# Patient Record
Sex: Male | Born: 2000
Health system: Southern US, Community
[De-identification: ages and names within clinical notes are randomized; demographics above are authoritative.]

---
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
## 2019-01-31 DIAGNOSIS — J3081 Allergic rhinitis due to animal (cat) (dog) hair and dander: Secondary | ICD-10-CM | POA: Diagnosis not present

## 2019-01-31 DIAGNOSIS — J3089 Other allergic rhinitis: Secondary | ICD-10-CM | POA: Diagnosis not present

## 2019-01-31 DIAGNOSIS — J301 Allergic rhinitis due to pollen: Secondary | ICD-10-CM | POA: Diagnosis not present

## 2019-01-31 DIAGNOSIS — H1045 Other chronic allergic conjunctivitis: Secondary | ICD-10-CM | POA: Diagnosis not present

## 2019-04-09 ENCOUNTER — Other Ambulatory Visit: Payer: Self-pay | Admitting: Psychiatry

## 2019-04-11 NOTE — Telephone Encounter (Signed)
Has appt 79/03  Sent duplicate requests

## 2019-04-11 NOTE — Telephone Encounter (Signed)
Patient is preparing for college and mother requests that his 2 medications from 10/18/2018 appointment be filled at her [facility] mail service sending duplicate escriptions apparently relative to upcoming college as 290-day E scription's each for sertraline 25 mg every morning and guanfacine 1 mg ER every bedtime.

## 2019-04-18 ENCOUNTER — Encounter: Payer: Self-pay | Admitting: Psychiatry

## 2019-04-18 ENCOUNTER — Ambulatory Visit: Payer: BC Managed Care – PPO | Admitting: Psychiatry

## 2019-04-18 ENCOUNTER — Other Ambulatory Visit: Payer: Self-pay

## 2019-04-18 VITALS — Ht 65.0 in | Wt 120.0 lb

## 2019-04-18 DIAGNOSIS — F411 Generalized anxiety disorder: Secondary | ICD-10-CM | POA: Diagnosis not present

## 2019-04-18 DIAGNOSIS — F902 Attention-deficit hyperactivity disorder, combined type: Secondary | ICD-10-CM

## 2019-04-18 DIAGNOSIS — F819 Developmental disorder of scholastic skills, unspecified: Secondary | ICD-10-CM | POA: Diagnosis not present

## 2019-04-18 MED ORDER — GUANFACINE HCL ER 1 MG PO TB24: 1.0000 mg | ORAL_TABLET | Freq: Every day | ORAL | 0 refills | Status: DC

## 2019-04-18 MED ORDER — SERTRALINE HCL 25 MG PO TABS: 25.0000 mg | ORAL_TABLET | Freq: Every morning | ORAL | 0 refills | Status: DC

## 2019-04-18 NOTE — Progress Notes (Signed)
[facility]  Patient ID: Jason GandyJohn Thomas Snow,  MRN: [id number]  PCP: Berline Lopes'Kelley, Brian, MD  Date of Evaluation: 04/18/2019 Time spent:15 minutes from 1415 to 1430  Chief Complaint:  Chief Complaint    Anxiety; ADHD; Stress      HISTORY/CURRENT STATUS: Jason Snow is seen onsite in office face-to-face conjointly with adoptive brother and mother with consent with epic collateral for adolescent psychiatric interview and exam and 3376-month evaluation and management of ADHD, generalized anxiety, and developmental scholastic skill disorder all of which may present some vulnerability and obstacle to freshman academic and social skills when at [employer] this fall.  As of last appointment, his acceptance and choice for college were still broad-based among 5 schools, but he and family are pleased with his choice and acceptance to [employer].  Jason Snow shows little or no emotion today in this regard at a time when he would be expected to be excited and emotionally mobilized.  Diagnostically this may question whether social communication disorder is present along with his scholastic skill disorder, though testing by Dr. Pascal LuxKane has been very thorough in past testing but patient's participation may have been suboptimal.  Similarly, he is untested in the college academic and social environment but highly intelligent.  Whether modifications may become necessary is not addressed with patient today as he finds such preparations culturally alienating and unnecessary rather than helpful and adaptive.  He will say that he did not get to go to [place] this summer but mother adds that he is still eligible for the [employer] adult trip to [place] for next summer.  He had contact with his roommate to be in the high-rise dorm in [place] so that they have been briefly introduced, though mother finds it remarkable that he still does not know where the roommate is from or what they share in common, particularly as they must share a living  space.  Mother is apprehensive about social distancing and mixed exposures in that setting in [place], but she is happy for the patient to have the educational opportunity.  Patient seems to have limited interaction with the adoptive father while his adoptive brother seems to have more of such interaction recently.  Patient has maintained medications and is compliant when younger brother is not always compliant.  The patient is unable to work out at the gym in this stay at home time of closure.  No adjustment in medications appears necessary today though we prepare for such in case the patient has increased anxiety or focus and consistency problems at college.  Session was brief last appointment as mother was in recovery and brother was overwhelmed, needing to be expeditious in history taking today for coronavirus precautions but more detailed in historical updating in preparation for his start of college.  He has no delirium, mania, psychosis, or dissociation.  He has no substance use or self-harm.  Anxiety        Symptoms include excessive worry and nervous/anxious behavior. Patient reports no decreased concentration,  panic or phobia, habit or cognitive fixation, depressed mood, malaise, nausea, obsessions, palpitations or suicidal ideas. Symptoms occur occasionally. The severity of symptoms is moderate. The quality of sleep is fair. Nighttime awakenings: occasional. Compliance with medications is 76-100%. Side effects of treatment include auditory problems.   Individual Medical History/ Review of Systems: Changes? :No There is no change in body habitus, metabolism, or hormonal developmental adaptation.  Allergies: Amantadines, Cephalosporins, and Minocycline  Current Medications:  Current Outpatient Medications:  .  guanFACINE (INTUNIV) 1 MG TB24 ER tablet, TAKE 1 TABLET BY MOUTH AT BEDTIME, Disp: 90 tablet, Rfl: 0 .  [START ON 07/10/2019] guanFACINE (INTUNIV) 1 MG TB24 ER tablet, Take 1 tablet (1 mg  total) by mouth at bedtime., Disp: 90 tablet, Rfl: 0 .  sertraline (ZOLOFT) 25 MG tablet, TAKE 1 TABLET BY MOUTH EVERY MORNING, Disp: 90 tablet, Rfl: 0 .  [START ON 07/10/2019] sertraline (ZOLOFT) 25 MG tablet, Take 1 tablet (25 mg total) by mouth every morning., Disp: 90 tablet, Rfl: 0   Medication Side Effects: none  Family Medical/ Social History: Changes? No ras elative closure of childhood for patient in the adoptive family is admitted emotionally by the patient's cultural expectations.  MENTAL HEALTH EXAM:  Height 5\' 5"  (1.651 m), weight 120 lb (54.4 kg).Body mass index is 19.97 kg/m.  Others deferred as nonessential in coronavirus pandemic  General Appearance: Casual, Guarded, Meticulous and Well Groomed  Eye Contact:  Good  Speech:  Clear and Coherent and Normal Rate  Volume:  Increased to normal  Mood:  Anxious and Euthymic  Affect:  Flat, Inappropriate, Restricted and Anxious  Thought Process:  Goal Directed, Irrelevant and Linear  Orientation:  Full (Time, Place, and Person)  Thought Content: Obsessions and Rumination   Suicidal Thoughts:  No  Homicidal Thoughts:  No  Memory:  Immediate;   Good Remote;   Fair  Judgement:  Good  Insight:  Fair  Psychomotor Activity:  Normal, Decreased and Mannerisms  Concentration:  Concentration: Fair and Attention Span: Fair  Recall:  Good  Fund of Knowledge: Good  Language: Good  Assets:  Desire for Improvement Leisure Time Talents/Skills Vocational/Educational  ADL's:  Intact  Cognition: WNL  Prognosis:  Good    DIAGNOSES:    ICD-10-CM   1. GAD (generalized anxiety disorder)  F41.1 sertraline (ZOLOFT) 25 MG tablet  2. Attention deficit hyperactivity disorder, combined type  F90.2 guanFACINE (INTUNIV) 1 MG TB24 ER tablet  3. Developmental disorder of scholastic skill  F81.9     Receiving Psychotherapy: No    RECOMMENDATIONS: Diagnosis such as social communication disorder is challenging to interpret being adopted from  [place] in family blending that alienates opportunity for patient's acquisition of more intimacy and emotional expression.  Still he exhibits and expresses that he is comfortable and capable without acute need for change.  The long-term outcome for patient appears good.  Accommodations can be updated at [facility] if needed though patient may not be interested in such similar to his posture today.  [facility] required duplicate E scription's for his sertraline 25 mg every morning for generalized anxiety and Intuniv 1 mg every bedtime for ADHD sent as duplicated 56-EPP supply of each on 04/11/2019 such that for his likelihood to still be at [facility] when refill might be necessary, an additional fill is sent for both as of 07/09/2021 to [facility].  He will be seen for follow-up in 5 months after first semester in college.   Delight Hoh, MD

## 2019-05-28 ENCOUNTER — Other Ambulatory Visit: Payer: Self-pay | Admitting: Psychiatry

## 2019-05-29 DIAGNOSIS — Z20828 Contact with and (suspected) exposure to other viral communicable diseases: Secondary | ICD-10-CM | POA: Diagnosis not present

## 2019-06-04 ENCOUNTER — Other Ambulatory Visit: Payer: Self-pay | Admitting: Emergency Medicine

## 2019-06-04 DIAGNOSIS — R6889 Other general symptoms and signs: Secondary | ICD-10-CM | POA: Diagnosis not present

## 2019-06-04 DIAGNOSIS — Z20822 Contact with and (suspected) exposure to covid-19: Secondary | ICD-10-CM

## 2019-06-05 LAB — NOVEL CORONAVIRUS, NAA: SARS-CoV-2, NAA: NOT DETECTED

## 2019-06-18 ENCOUNTER — Other Ambulatory Visit: Payer: Self-pay

## 2019-06-18 DIAGNOSIS — R6889 Other general symptoms and signs: Secondary | ICD-10-CM | POA: Diagnosis not present

## 2019-06-18 DIAGNOSIS — Z20822 Contact with and (suspected) exposure to covid-19: Secondary | ICD-10-CM

## 2019-06-20 LAB — NOVEL CORONAVIRUS, NAA: SARS-CoV-2, NAA: NOT DETECTED

## 2019-08-11 ENCOUNTER — Other Ambulatory Visit: Payer: Self-pay | Admitting: Psychiatry

## 2019-09-23 ENCOUNTER — Ambulatory Visit (INDEPENDENT_AMBULATORY_CARE_PROVIDER_SITE_OTHER): Payer: BC Managed Care – PPO | Admitting: Psychiatry

## 2019-09-23 ENCOUNTER — Encounter: Payer: Self-pay | Admitting: Psychiatry

## 2019-09-23 DIAGNOSIS — F819 Developmental disorder of scholastic skills, unspecified: Secondary | ICD-10-CM | POA: Diagnosis not present

## 2019-09-23 DIAGNOSIS — F411 Generalized anxiety disorder: Secondary | ICD-10-CM | POA: Diagnosis not present

## 2019-09-23 DIAGNOSIS — F902 Attention-deficit hyperactivity disorder, combined type: Secondary | ICD-10-CM

## 2019-09-23 MED ORDER — GUANFACINE HCL ER 1 MG PO TB24: 1.0000 mg | ORAL_TABLET | Freq: Every day | ORAL | 1 refills | Status: DC

## 2019-09-23 MED ORDER — SERTRALINE HCL 25 MG PO TABS: 25.0000 mg | ORAL_TABLET | Freq: Every day | ORAL | 1 refills | Status: AC

## 2019-09-23 NOTE — Progress Notes (Addendum)
[facility]  Patient ID: Jason Snow,  MRN: [id number]  PCP: Sydell Axon, MD  Date of Evaluation: 09/23/2019 Time spent:15 minutes from 1425 to 1440  Chief Complaint:  Chief Complaint    ADHD; Anxiety; Stress      HISTORY/CURRENT STATUS: Jason Snow is provided telemedicine audiovisual appointment session, declining the video camera due to anxiety and current college status, calling younger brother first as often foremost in the past who directs me to call Jason Snow first as brother is busy with sports, phone to phone 15 minutes individually with consent with epic collateral for psychiatric interview and exam in 40-month evaluation and management of ADHD and anxiety with history of academic skill disorder.  Patient is back at home from his semester break reporting he is pleased with his selection of [place] though clarifying that with coronavirus shutdown there is not much to do.  He has no interim contact with Dr. Barron Alvine and will likely have outgrown her services now that he is 18 years of age and in college at least relative to therapy.  He continues Zoloft 25 mg every morning and Intuniv 1 mg every bedtime as a stable in its treatment for the last 6 years having no adverse effects and occasions serve his needs well at college similar to previously in schooling.  Patient has always been socially constricted and avoidant in this office even with family present, but he allows clarification of the individuation and course of maturity often attendant to college experiences.  He has not started therapy there, though we discuss opportunity at [place] [facility].  The session today attempts to consolidate self directed fulfillment of such tasks of the first semester to generalized to the second in his freshman year anticipating that maintenance treatment may again become active therapeutic progress toward adulthood.  Has no mania, suicidality, psychosis or  delirium.  Anxiety        Symptoms includeexcessive worryand nervous/anxious behavior.  Associated symptoms include social constriction, boredom, anxious diathesis undoing adaptive affect, and adequate but not optimal mental preparation for college activities and life. Patient reports nodecreased concentration, panic or phobia,cognitive fixation or habit, manic or psychotic symptom, depressed mood,malaise,nausea,obsessions,palpitationsor suicidal ideas. Symptoms occuroccasionally. The severity of symptoms ismoderate. The quality of sleep isfair. Nighttime awakenings:occasional. Compliance with medications is76-100%. Side effects of treatment includeauditory problems. Individual Medical History/ Review of Systems: Changes? :Yes self reporting weight of 120-125 having been 120 pounds last appointment with height 65 inches.  Patient did have Covid testing 06/18/2019 that was negative.  Allergies: Amantadines, Cephalosporins, and Minocycline  Current Medications:  Current Outpatient Medications:  .  guanFACINE (INTUNIV) 1 MG TB24 ER tablet, TAKE 1 TABLET BY MOUTH AT BEDTIME, Disp: 90 tablet, Rfl: 0 .  guanFACINE (INTUNIV) 1 MG TB24 ER tablet, Take 1 tablet (1 mg total) by mouth at bedtime., Disp: 90 tablet, Rfl: 0 .  guanFACINE (INTUNIV) 1 MG TB24 ER tablet, TAKE ONE TABLET AT BEDTIME., Disp: 10 tablet, Rfl: 0 .  guanFACINE (INTUNIV) 1 MG TB24 ER tablet, TAKE ONE TABLET AT BEDTIME., Disp: 30 tablet, Rfl: 0 .  sertraline (ZOLOFT) 25 MG tablet, TAKE 1 TABLET BY MOUTH EVERY MORNING, Disp: 90 tablet, Rfl: 0 .  sertraline (ZOLOFT) 25 MG tablet, Take 1 tablet (25 mg total) by mouth every morning., Disp: 90 tablet, Rfl: 0  Medication Side Effects: none  Family Medical/ Social History: Changes? No  MENTAL HEALTH EXAM:  There were no vitals taken for this visit.There is  no height or weight on file to calculate BMI.  Not present in office today  General Appearance: N/A  Eye Contact:  N/A   Speech:  Clear and Coherent, Normal Rate and Talkative  Volume:  Decreased  Mood:  Anxious, Dysphoric and Euthymic  Affect:  Constricted, Inappropriate, Restricted and Anxious  Thought Process:  Coherent, Irrelevant, Linear and Descriptions of Associations: Tangential  Orientation:  Full (Time, Place, and Person)  Thought Content: Rumination and Tangential   Suicidal Thoughts:  No  Homicidal Thoughts:  No  Memory:  Immediate;   Good Remote;   Good  Judgement:  Good  Insight:  Fair  Psychomotor Activity:  N/A  Concentration:  Concentration: Fair and Attention Span: Fair today  Recall:  Good  Fund of Knowledge: Good  Language: Fair  Assets:  Desire for Improvement Resilience Vocational/Educational  ADL's:  Intact  Cognition: WNL  Prognosis:  Good    DIAGNOSES:    ICD-10-CM   1. Attention deficit hyperactivity disorder, combined type  F90.2   2. GAD (generalized anxiety disorder)  F41.1   3. Developmental disorder of scholastic skill  F81.9     Receiving Psychotherapy: No    RECOMMENDATIONS: Psychosupportive psychoeducation occupies over 50% of the 15-minute phone to phone time for total of 8 minutes patient having more interest than usual in the content of the session but not yet the process.  We attempt to apply cognitive behavioral social skills and and solution focused integration to facilitate patient's utilization of his college environment to build academic and social skill for use in adult life.  He notes medications are reviewed and updated in supply sending Zoloft 25 mg every morning as #90 with 1 refill E scribed to [facility] for anxiety and ADHD.  Intuniv 1 mg every bedtime is E scribed as #90 with 1 refill to [facility] for ADHD and anxiety.  Jason Snow returns for follow-up in 6 months or sooner if needed.  Mother phones office after the patient and brother appointments to clarify that she requires escriptions to go to [facility] pharmacy rather than [facility] therefore  resent.  Virtual Visit via Video Note  I connected with Berneice Gandy on 09/23/19 at  2:20 PM EST by a video enabled telemedicine application and verified that I am speaking with the correct person using two identifiers.  Location: Patient: Individually at family residence on break from [place] audio only due to GAD no video camera Provider: [facility]   I discussed the limitations of evaluation and management by telemedicine and the availability of in person appointments. The patient expressed understanding and agreed to proceed.  History of Present Illness: 40-month evaluation and management of ADHD and anxiety with history of academic skill disorder.  Patient is back at home from his semester break reporting he is pleased with his selection of [place] though clarifying that with coronavirus shutdown there is not much to do   Observations/Objective: Mood:  Anxious, Dysphoric and Euthymic  Affect:  Constricted, Inappropriate, Restricted and Anxious  Thought Process:  Coherent, Irrelevant, Linear and Descriptions of Associations: Tangential   Concentration:  Concentration: Fair and Attention Span: Fair today  Recall:  Good  Fund of Knowledge: Good   Assessment and Plan: Psychosupportive psychoeducation occupies over 50% of the 15-minute phone to phone time for total of 8 minutes patient having more interest than usual in the content of the session but not yet the process.  We attempt to apply cognitive  behavioral social skills and and solution focused integration to facilitate patient's utilization of his college environment to build academic and social skill for use in adult life.  He notes medications are reviewed and updated in supply sending Zoloft 25 mg every morning as #90 with 1 refill E scribed to [facility] for anxiety and ADHD.  Intuniv 1 mg every bedtime is E scribed as #90 with 1 refill to [facility] for ADHD and anxiety.  Follow Up  Instructions: Jason Snow returns for follow-up in 6 months or sooner if needed    I discussed the assessment and treatment plan with the patient. The patient was provided an opportunity to ask questions and all were answered. The patient agreed with the plan and demonstrated an understanding of the instructions.   The patient was advised to call back or seek an in-person evaluation if the symptoms worsen or if the condition fails to improve as anticipated.  I provided 15 minutes of non-face-to-face time during this encounter. [employer] meeting #[phone] Meeting password: [email or web address]  Chauncey MannGlenn E Mark Benecke, MD  Chauncey MannGlenn E Deanndra Kirley, MD

## 2019-09-25 DIAGNOSIS — Z0000 Encounter for general adult medical examination without abnormal findings: Secondary | ICD-10-CM | POA: Diagnosis not present

## 2019-10-18 ENCOUNTER — Ambulatory Visit: Payer: BC Managed Care – PPO | Attending: Internal Medicine

## 2019-10-18 DIAGNOSIS — Z20822 Contact with and (suspected) exposure to covid-19: Secondary | ICD-10-CM

## 2019-10-19 LAB — NOVEL CORONAVIRUS, NAA: SARS-CoV-2, NAA: NOT DETECTED

## 2020-03-23 ENCOUNTER — Encounter: Payer: Self-pay | Admitting: Psychiatry

## 2020-03-23 ENCOUNTER — Ambulatory Visit (INDEPENDENT_AMBULATORY_CARE_PROVIDER_SITE_OTHER): Payer: BC Managed Care – PPO | Admitting: Psychiatry

## 2020-03-23 ENCOUNTER — Other Ambulatory Visit: Payer: Self-pay

## 2020-03-23 VITALS — Ht 66.0 in | Wt 120.0 lb

## 2020-03-23 DIAGNOSIS — F411 Generalized anxiety disorder: Secondary | ICD-10-CM | POA: Diagnosis not present

## 2020-03-23 DIAGNOSIS — F819 Developmental disorder of scholastic skills, unspecified: Secondary | ICD-10-CM

## 2020-03-23 DIAGNOSIS — F902 Attention-deficit hyperactivity disorder, combined type: Secondary | ICD-10-CM | POA: Diagnosis not present

## 2020-03-23 MED ORDER — GUANFACINE HCL ER 1 MG PO TB24: 1.0000 mg | ORAL_TABLET | Freq: Every day | ORAL | 1 refills | Status: AC

## 2020-03-23 MED ORDER — SERTRALINE HCL 25 MG PO TABS: 25.0000 mg | ORAL_TABLET | Freq: Every morning | ORAL | 1 refills | Status: AC

## 2020-03-23 NOTE — Progress Notes (Signed)
[facility]  Patient ID: Roxie Gueye,  MRN: [id number]  PCP: Berline Lopes, MD  Date of Evaluation: 03/23/2020 Time spent:15 minutes from 1600 to 1615  Chief Complaint:  Chief Complaint    Anxiety; ADHD      HISTORY/CURRENT STATUS: JT is seen Onsite in office 15 minutes face-to-face conjointly with younger brother by JT's adoption with consent with epic collateral for psychiatric interview and exam in 27-month evaluation and management of generalized anxiety and ADHD comorbid with developmental disorder of scholastic skills as per Marisa Severin, PhD.  Patient has no interim therapy at [facility] state or contact with Dr. Pascal Lux, making all A's in freshman year of college.  Weight is down 2 pounds in the last year.  He reports summer class is intro to [employer].  He is otherwise in good general health with no interim concerns and [place] registry is negative.  Clinical review concludes that request of patient and family to continue his medications is appropriate. He has no mania, suicidality, psychosis or delirium.   Individual Medical History/ Review of Systems: Changes? :No   Allergies: Amantadines, Cephalosporins, and Minocycline  Current Medications:  Current Outpatient Medications:  .  guanFACINE (INTUNIV) 1 MG TB24 ER tablet, Take 1 tablet (1 mg total) by mouth at bedtime., Disp: 90 tablet, Rfl: 1 .  sertraline (ZOLOFT) 25 MG tablet, Take 1 tablet (25 mg total) by mouth daily after breakfast., Disp: 90 tablet, Rfl: 1 .  sertraline (ZOLOFT) 25 MG tablet, Take 1 tablet (25 mg total) by mouth every morning., Disp: 90 tablet, Rfl: 1  Medication Side Effects: none  Family Medical/ Social History: Changes? No  MENTAL HEALTH EXAM:  Height 5\' 6"  (1.676 m), weight 120 lb (54.4 kg).Body mass index is 19.37 kg/m. Muscle strengths and tone 5/5, postural reflexes and gait 0/0, and AIMS = 0.  General Appearance: Casual, Meticulous and Well Groomed  Eye Contact:  Good   Speech:  Clear and Coherent, Normal Rate and Talkative  Volume:  Normal  Mood:  Anxious and Euthymic  Affect:  Full Range and Anxious  Thought Process:  Coherent, Goal Directed, Linear and Descriptions of Associations: Tangential  Orientation:  Full (Time, Place, and Person)  Thought Content: Rumination and Tangential   Suicidal Thoughts:  No  Homicidal Thoughts:  No  Memory:  Immediate;   Good Remote;   Good  Judgement:  Good  Insight:  Fair  Psychomotor Activity:  Normal and Mannerisms  Concentration:  Concentration: Good and Attention Span: Fair  Recall:  Good  Fund of Knowledge: Good  Language: Fair  Assets:  Desire for Improvement Resilience Talents/Skills Vocational/Educational  ADL's:  Intact  Cognition: WNL  Prognosis:  Good    DIAGNOSES:    ICD-10-CM   1. GAD (generalized anxiety disorder)  F41.1 sertraline (ZOLOFT) 25 MG tablet  2. Attention deficit hyperactivity disorder, combined type  F90.2 sertraline (ZOLOFT) 25 MG tablet    guanFACINE (INTUNIV) 1 MG TB24 ER tablet  3. Developmental disorder of scholastic skill  F81.9     Receiving Psychotherapy: No    RECOMMENDATIONS: Psychosupportive psychoeducation updates prevention and monitoring safety hygiene for symptom treatment matching with medications.  He is E scribed Zoloft 25 mg every morning sent as #90 with 1 refill to [facility] for generalized anxiety and ADHD.  He is E scribed Intuniv 1 mg every bedtime sent as #90 and 1 refill to [facility] for the ADHD.  He returns in 6 months for  follow-up for sooner if needed  Delight Hoh, MD

## 2020-07-22 ENCOUNTER — Encounter: Payer: Self-pay | Admitting: Psychiatry

## 2020-09-22 ENCOUNTER — Ambulatory Visit: Payer: BC Managed Care – PPO | Admitting: Psychiatry

## 2020-10-06 ENCOUNTER — Ambulatory Visit: Payer: BC Managed Care – PPO | Admitting: Psychiatry

## 2022-08-02 DIAGNOSIS — M79671 Pain in right foot: Secondary | ICD-10-CM | POA: Diagnosis not present
